# Patient Record
Sex: Male | Born: 1975
Health system: Southern US, Community
[De-identification: ages and names within clinical notes are randomized; demographics above are authoritative.]

## PROBLEM LIST (undated history)

## (undated) HISTORY — PX: OTHER SURGICAL HISTORY: SHX169

---

## 2001-02-26 ENCOUNTER — Ambulatory Visit (HOSPITAL_BASED_OUTPATIENT_CLINIC_OR_DEPARTMENT_OTHER): Admission: RE | Admit: 2001-02-26 | Discharge: 2001-02-26 | Payer: Self-pay | Admitting: Orthopedic Surgery

## 2017-06-15 ENCOUNTER — Encounter (HOSPITAL_COMMUNITY): Payer: Self-pay | Admitting: Emergency Medicine

## 2017-06-15 ENCOUNTER — Emergency Department (HOSPITAL_COMMUNITY): Payer: 59

## 2017-06-15 ENCOUNTER — Emergency Department (HOSPITAL_COMMUNITY)
Admission: EM | Admit: 2017-06-15 | Discharge: 2017-06-16 | Disposition: A | Discharge status: Home / Self Care | Payer: 59 | Attending: Emergency Medicine | Admitting: Emergency Medicine

## 2017-06-15 ENCOUNTER — Other Ambulatory Visit: Payer: Self-pay

## 2017-06-15 DIAGNOSIS — W010XXA Fall on same level from slipping, tripping and stumbling without subsequent striking against object, initial encounter: Secondary | ICD-10-CM | POA: Diagnosis not present

## 2017-06-15 DIAGNOSIS — S42031A Displaced fracture of lateral end of right clavicle, initial encounter for closed fracture: Secondary | ICD-10-CM | POA: Insufficient documentation

## 2017-06-15 DIAGNOSIS — Y9383 Activity, rough housing and horseplay: Secondary | ICD-10-CM | POA: Diagnosis not present

## 2017-06-15 DIAGNOSIS — S4991XA Unspecified injury of right shoulder and upper arm, initial encounter: Secondary | ICD-10-CM | POA: Diagnosis present

## 2017-06-15 DIAGNOSIS — Y929 Unspecified place or not applicable: Secondary | ICD-10-CM | POA: Insufficient documentation

## 2017-06-15 DIAGNOSIS — Y999 Unspecified external cause status: Secondary | ICD-10-CM | POA: Insufficient documentation

## 2017-06-15 MED ORDER — OXYCODONE-ACETAMINOPHEN 5-325 MG PO TABS
1.0000 | ORAL_TABLET | Freq: Once | ORAL | Status: AC
  Administered 2017-06-15: 1 via ORAL
  Filled 2017-06-15: qty 1

## 2017-06-15 NOTE — ED Provider Notes (Signed)
[facility] EMERGENCY DEPARTMENT Provider Note   CSN: [id number] Arrival date & time: 06/15/17  2158     History   Chief Complaint Chief Complaint  Patient presents with  . Shoulder Injury    HPI Glen Price is a 42 y.o. male with no significant past medical history who presents today for evaluation of right shoulder pain.  He was playing kickball with his child when he tripped causing him to fall/do a somersault landing on his back.  He reports that he had sudden onset of pain in his right shoulder.  He denies any numbness or tingling, he did not pass out or lose consciousness.This was a mechanical fall.  No headache, neck pain, back pain, chest pain or SOB.      HPI  History reviewed. No pertinent past medical history.  There are no active problems to display for this patient.   Past Surgical History:  Procedure Laterality Date  . arm surgery     to remove FB        Home Medications    Prior to Admission medications   Medication Sig Start Date End Date Taking? Authorizing Provider  oxyCODONE-acetaminophen (PERCOCET/ROXICET) 5-325 MG tablet Take 1 tablet by mouth every 6 (six) hours as needed for severe pain. 06/16/17   Cristina Gong, PA-C    Family History No family history on file.  Social History Social History   Tobacco Use  . Smoking status: Never Smoker  . Smokeless tobacco: Never Used  Substance Use Topics  . Alcohol use: Yes  . Drug use: Never     Allergies   Patient has no known allergies.   Review of Systems Review of Systems  Constitutional: Negative for chills and fever.  HENT: Negative for congestion.   Respiratory: Negative for shortness of breath.   Gastrointestinal: Negative for abdominal pain.  Musculoskeletal: Negative for neck pain and neck stiffness.       Pain in his right shoulder  Neurological: Negative for weakness, numbness and headaches.     Physical Exam Updated Vital Signs BP 128/90  (BP Location: Left Arm)   Pulse 81   Temp 99 F (37.2 C) (Oral)   Resp 18   Ht  (1.753 m)   Wt 108.9 kg (240 lb)   SpO2 95%   BMI 35.44 kg/m   Physical Exam  Constitutional: He is oriented to person, place, and time. He appears well-developed and well-nourished. No distress.  HENT:  Head: Normocephalic and atraumatic. Head is without raccoon's eyes and without Battle's sign.  Right Ear: Tympanic membrane, external ear and ear canal normal. No hemotympanum.  Left Ear: Tympanic membrane, external ear and ear canal normal. No hemotympanum.  Mouth/Throat: Oropharynx is clear and moist.  Eyes: Pupils are equal, round, and reactive to light. Conjunctivae and EOM are normal. Right eye exhibits no discharge. Left eye exhibits no discharge. No scleral icterus.  Neck: Normal range of motion. Neck supple.  Cardiovascular: Normal rate, regular rhythm, normal heart sounds and intact distal pulses. Exam reveals no friction rub.  No murmur heard. Pulmonary/Chest: Effort normal and breath sounds normal. No stridor. No respiratory distress.  Abdominal: Soft. Bowel sounds are normal. He exhibits no distension. There is no tenderness.  Musculoskeletal: He exhibits no edema or deformity.  5/5 grip strength bilaterally.  Right superior shoulder has obvious deformity with swelling and tenderness to palpation.  C/T/L-spine without midline tenderness to palpation.  Patient has full range of motion of  right wrist, and elbow, however shoulder use limited secondary to pain.  Right sided lateral neck at the junction of shoulder and neck is mildly TTP.    Neurological: He is alert and oriented to person, place, and time. No sensory deficit. He exhibits normal muscle tone.  Skin: Skin is warm and dry. He is not diaphoretic.  Psychiatric: He has a normal mood and affect. His behavior is normal.  Nursing note and vitals reviewed.    ED Treatments / Results  Labs (all labs ordered are listed, but only  abnormal results are displayed) Labs Reviewed  CBG MONITORING, ED - Abnormal; Notable for the following components:      Result Value   Glucose-Capillary 104 (*)    All other components within normal limits    EKG EKG Interpretation  Date/Time:  Tuesday Jun 16 2017 00:37:46 EDT Ventricular Rate:  77 PR Interval:  192 QRS Duration: 116 QT Interval:  400 QTC Calculation: 452 R Axis:   77 Text Interpretation:  Normal sinus rhythm Normal ECG No old tracing to compare Confirmed by Dione Booze ([id number]) on 06/16/2017 12:44:24 AM   Radiology Dg Clavicle Right  Result Date: 06/15/2017 CLINICAL DATA:  Acute onset of right shoulder pain after fall. Initial encounter. EXAM: RIGHT CLAVICLE - 2+ VIEWS COMPARISON:  None. FINDINGS: There is a significantly comminuted fracture of the distal right clavicle, with displacement of multiple fragments. The right acromioclavicular joint appears grossly intact. Overlying soft tissue swelling is noted. The remainder of the clavicle is unremarkable. The right humeral head remains seated at the glenoid fossa. IMPRESSION: Significantly comminuted fracture of the distal right clavicle, with displacement of multiple fragments. Electronically Signed   By: Roanna Raider M.D.   On: 06/15/2017 23:36   Dg Shoulder Right  Result Date: 06/15/2017 CLINICAL DATA:  Acute onset of right shoulder pain after fall. Initial encounter. EXAM: RIGHT SHOULDER - 2+ VIEW COMPARISON:  None. FINDINGS: There is a comminuted fracture of the distal right clavicle, with significant displacement of multiple fragments. The right acromioclavicular joint appears grossly intact. No additional fractures are seen. The right humeral head remains seated at the glenoid fossa. Soft tissue swelling is noted about the fracture site. The visualized portions of the right lung are clear. IMPRESSION: Comminuted fracture of the distal right clavicle, with significant displacement of multiple fragments.  Electronically Signed   By: Roanna Raider M.D.   On: 06/15/2017 23:36   Ct Cervical Spine Wo Contrast  Result Date: 06/15/2017 CLINICAL DATA:  Fall while playing baseball. EXAM: CT CERVICAL SPINE WITHOUT CONTRAST TECHNIQUE: Multidetector CT imaging of the cervical spine was performed without intravenous contrast. Multiplanar CT image reconstructions were also generated. COMPARISON:  None. FINDINGS: Alignment: No static subluxation. Facets are aligned. Occipital condyles and the lateral masses of C1 and C2 are normally approximated. Skull base and vertebrae: No acute fracture. Soft tissues and spinal canal: No prevertebral fluid or swelling. No visible canal hematoma. Disc levels: No advanced spinal canal or neural foraminal stenosis. Upper chest: No pneumothorax, pulmonary nodule or pleural effusion. Other: Normal visualized paraspinal cervical soft tissues. IMPRESSION: Normal CT of the cervical spine. Electronically Signed   By: Deatra Robinson M.D.   On: 06/15/2017 23:55    Procedures Procedures (including critical care time)  Medications Ordered in ED Medications  oxyCODONE-acetaminophen (PERCOCET/ROXICET) 5-325 MG per tablet 1 tablet (1 tablet Oral Given 06/15/17 2341)     Initial Impression / Assessment and Plan / ED Course  I have reviewed  the triage vital signs and the nursing notes.  Pertinent labs & imaging results that were available during my care of the patient were reviewed by me and considered in my medical decision making (see chart for details).  Clinical Course as of Jun 16 136  Tue Jun 16, 2017  0034 While in room patient reported that he didn't feel right, he went very pale and diaphoretic.  He then passed out in the chair.  He was laid flat.  He was very diaphoretic.  He did not hit his head.  He had slight body wide stiffining for about 1 second then came back to, was unconsious for about 10 seconds.  Came back to with no slurred speech or weakness.  Blood sugar ok.  EKG  ordered.  Patient back to baseline.    [EH]  0136 Patient reevaluated, he has normal color, is interacting appropriately, joking with family in the room, he is able to stand and move around without feeling lightheaded.  He has eaten and drank and feels normal.  Patient states he is ready to go home.  Syncope precautions were discussed.  Patient was advised not to drive with broken collarbone and states his understanding.   [EH]    Clinical Course User Index [EH] Cristina Gong, PA-C   Patient presents today for evaluation of a right sided shoulder injury that occurred earlier this afternoon.  He had obvious deformity of the right shoulder.  Based on the mechanism of injury and concern for a distracting injury, CT neck was obtained without acute abnormalities.  Shoulder x-rays and clavicle x-ray were obtained showing a distal severely comminuted right sided clavicle fracture.  The skin over this area is intact without any evidence of tenting.  Patient was given oxycodone while in the emergency room.    Patient had a syncopal event that I witnessed while he was seated in a chair, he was tonic for a few seconds without any clonic movements, pale, and diaphoretic, he regained consciousness once he was laid down.  No seizure-like activity.  No slurred speech or weakness after.  Blood sugar checked was normal, EKG obtained without acute abnormalities, as patient is back to baseline not concerned for significant hematologic or electrolyte abnormalities causing his syncopal event.  He did not strike his head during this injury.    She was observed in the emergency room for approximately 1 hour after syncopal event, during which he was able to eat, drink, and retained to normal baseline, he was able to ambulate without feeling lightheaded or dizzy.  Syncope cautions were discussed and he states his understanding.  PCP follow-up for syncope.  Final Clinical Impressions(s) / ED Diagnoses   Final  diagnoses:  Closed displaced fracture of acromial end of right clavicle, initial encounter    ED Discharge Orders        Ordered    oxyCODONE-acetaminophen (PERCOCET/ROXICET) 5-325 MG tablet  Every 6 hours PRN     06/16/17 0026       Cristina Gong, PA-C 06/16/17 0138    Dione Booze, MD 06/16/17 [phone]

## 2017-06-15 NOTE — ED Triage Notes (Signed)
Pt presents c/o R shoulder pain after fall onto shoulder while playing ball with child.

## 2017-06-16 LAB — CBG MONITORING, ED: Glucose-Capillary: 104 mg/dL — ABNORMAL HIGH (ref 65–99)

## 2017-06-16 MED ORDER — OXYCODONE-ACETAMINOPHEN 5-325 MG PO TABS: 1.0000 | ORAL_TABLET | Freq: Four times a day (QID) | ORAL | 0 refills | Status: AC | PRN

## 2017-06-16 NOTE — Discharge Instructions (Addendum)

## 2017-06-16 NOTE — ED Notes (Signed)
Pt left at this time with all belongings.  

## 2018-11-01 IMAGING — CT CT CERVICAL SPINE W/O CM
3 of 4 series · 14 of 33 positions shown, 17 images · non-contrast
Comparison: None.

CLINICAL DATA: Fall while playing baseball.

EXAM:
CT CERVICAL SPINE WITHOUT CONTRAST
TECHNIQUE: Multidetector CT imaging of the cervical spine was performed without
intravenous contrast. Multiplanar CT image reconstructions were also
generated.

[Series 5: c_spine 2.0 st · axial · 0.32mm/px · z∈[-220,-82]mm · 6 of 97 slices shown, 8 images]
[im 14/97  soft-tissue]
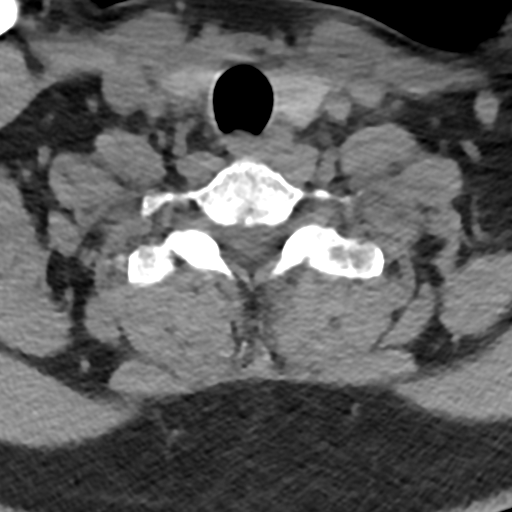
[im 14/97  bone]
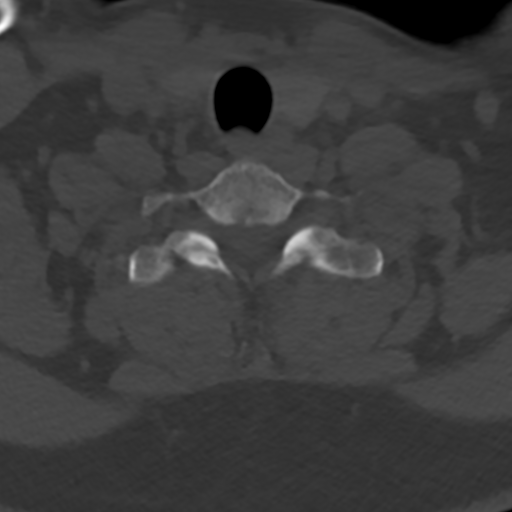
[im 28/97  bone]
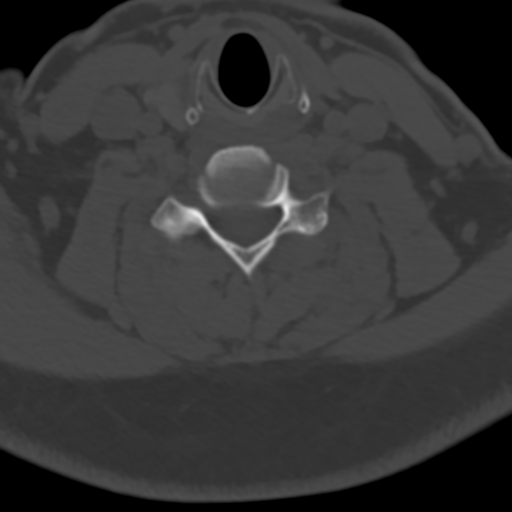
[im 42/97  bone]
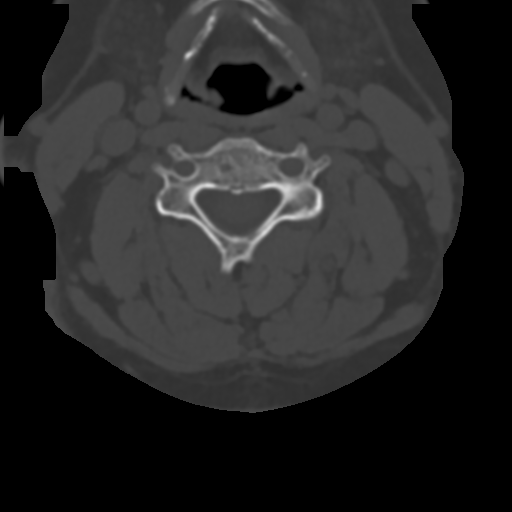
[im 55/97  bone]
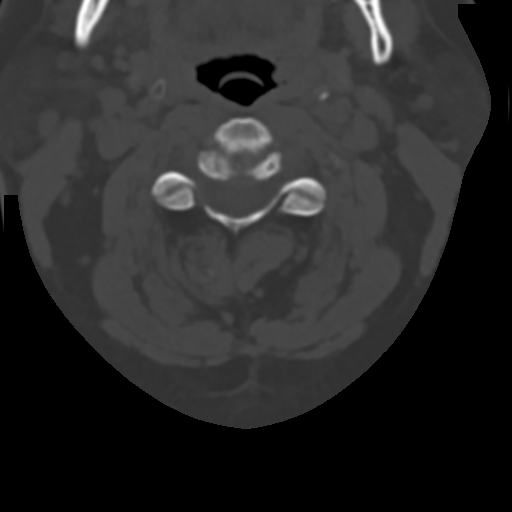
[im 69/97  soft-tissue]
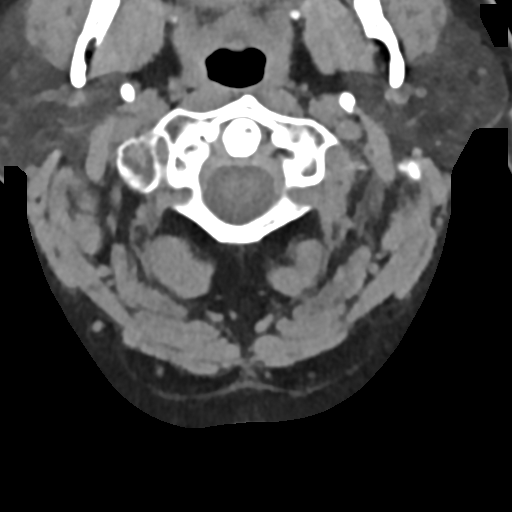
[im 69/97  bone]
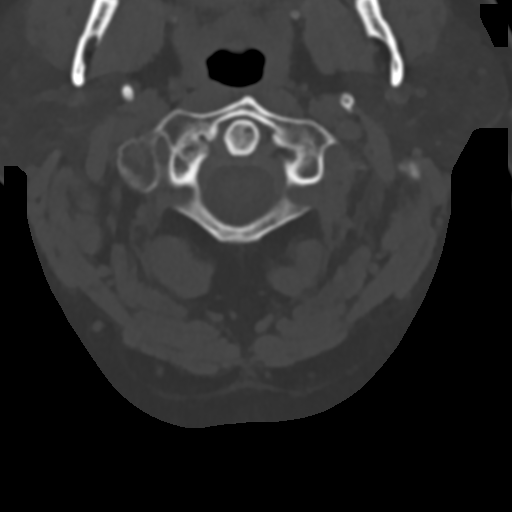
[im 83/97  bone]
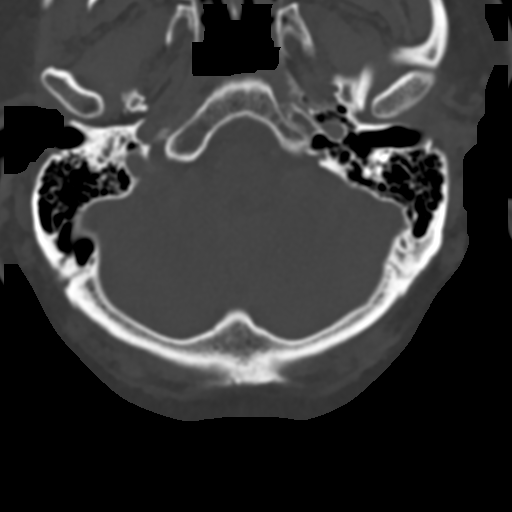

[Series 6: coronal bone · coronal · 0.29mm/px · 3 of 61 slices shown]
[im 13/61  bone]
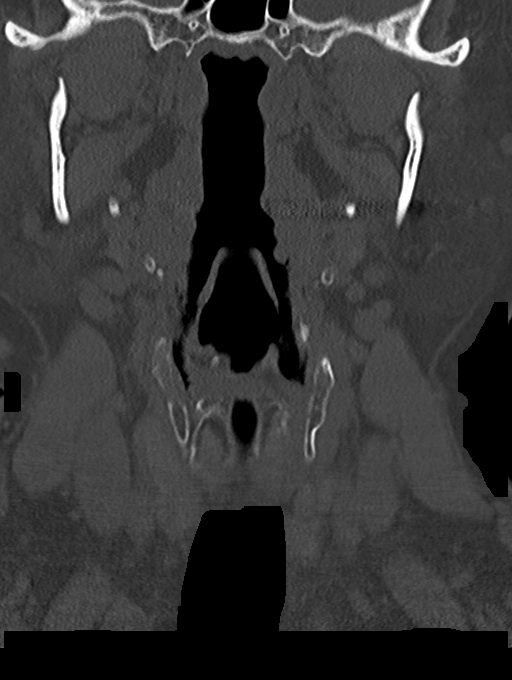
[im 25/61  bone]
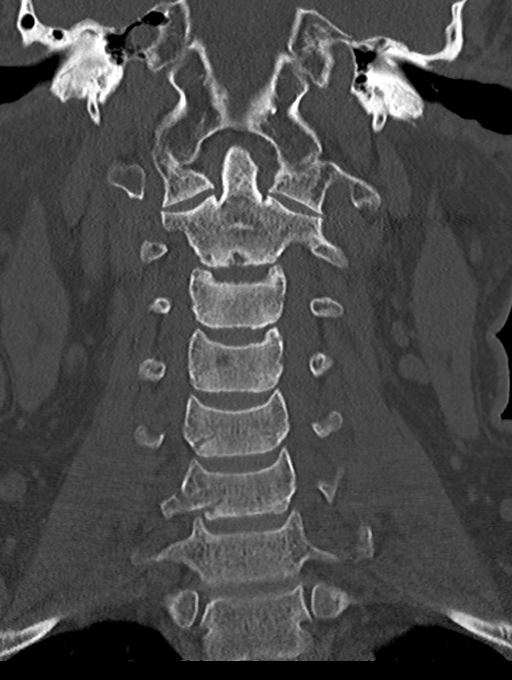
[im 37/61  bone]
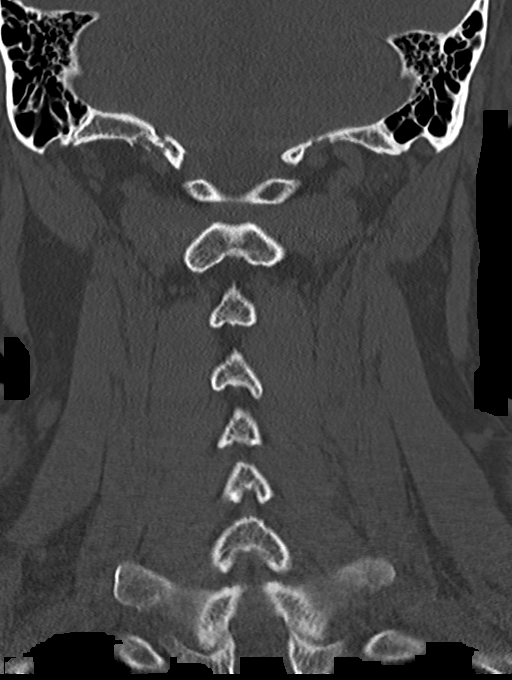

[Series 7: sagittal bone · sagittal · 0.29mm/px · 5 of 61 slices shown, 6 images]
[im 21/61  bone]
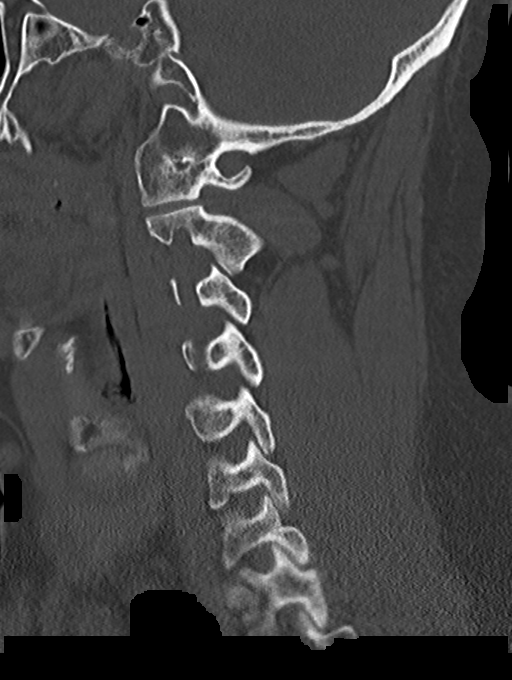
[im 26/61  bone]
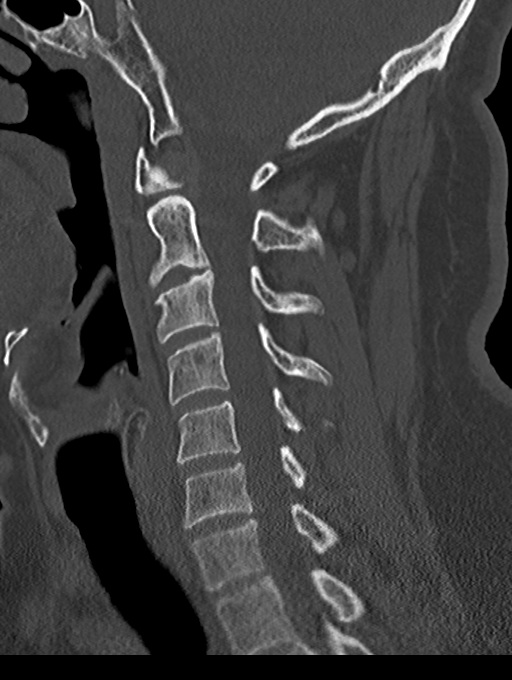
[im 31/61  soft-tissue]
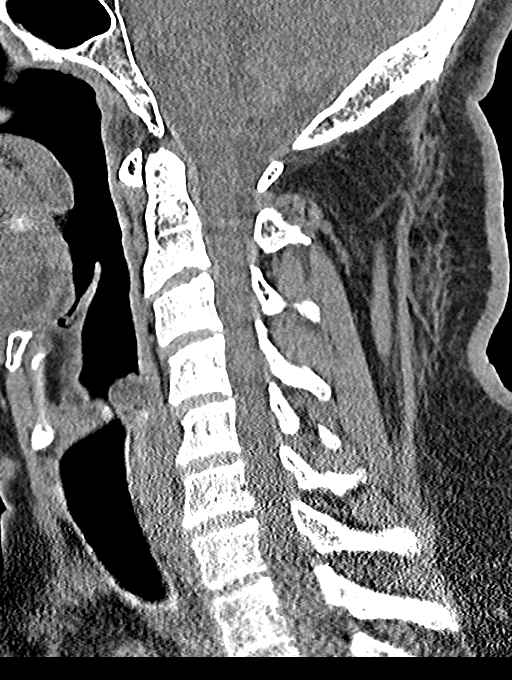
[im 31/61  bone]
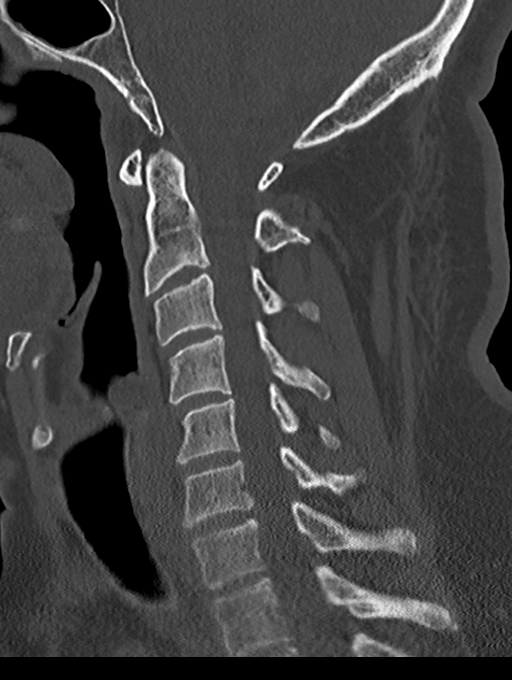
[im 36/61  bone]
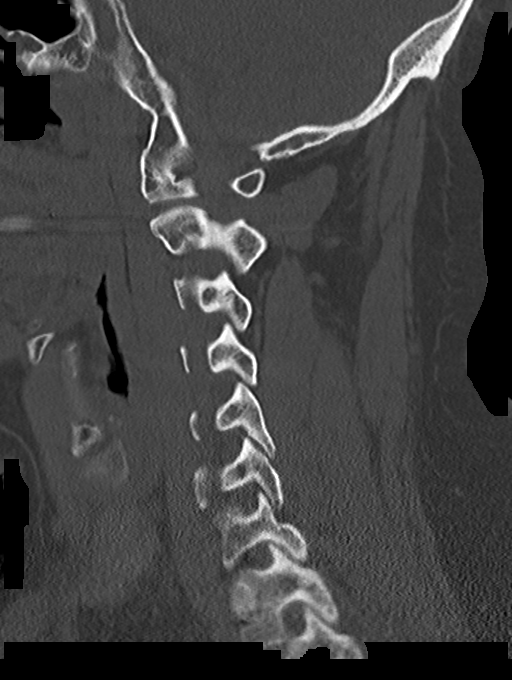
[im 41/61  bone]
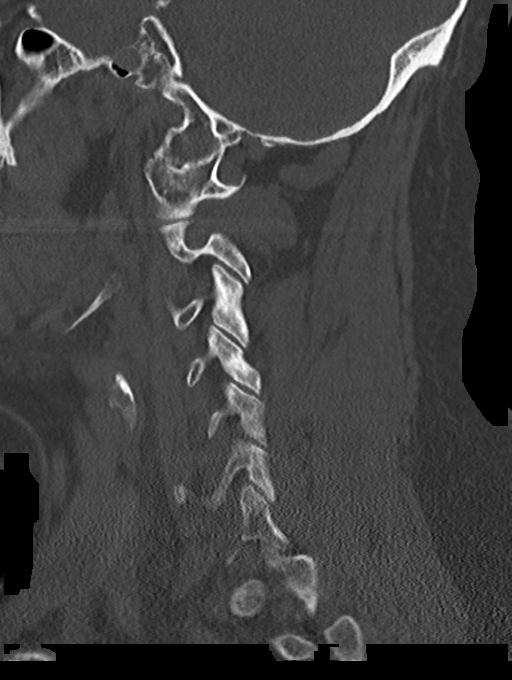

[14 of 33 positions shown; findings below may reference images not displayed]

FINDINGS: Alignment: No static subluxation. Facets are aligned. Occipital
condyles and the lateral masses of C1 and C2 are normally
approximated.

Skull base and vertebrae: No acute fracture.

Soft tissues and spinal canal: No prevertebral fluid or swelling. No
visible canal hematoma.

Disc levels: No advanced spinal canal or neural foraminal stenosis.

Upper chest: No pneumothorax, pulmonary nodule or pleural effusion.

Other: Normal visualized paraspinal cervical soft tissues.
IMPRESSION: Normal CT of the cervical spine.
